# Patient Record
Sex: Male | Born: 1990 | Race: Black or African American | Hispanic: No | Marital: Single | Smoking: Former smoker
Health system: Southern US, Community
[De-identification: ages and names within clinical notes are randomized; demographics above are authoritative.]

---
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
## 2011-03-18 LAB — URINE CULTURE
Colony Count: NO GROWTH
Culture: NO GROWTH

## 2011-03-18 LAB — URINALYSIS, ROUTINE W REFLEX MICROSCOPIC
Bilirubin Urine: NEGATIVE
Glucose, UA: NEGATIVE mg/dL
Hgb urine dipstick: NEGATIVE
Ketones, ur: NEGATIVE mg/dL
Nitrite: NEGATIVE
Protein, ur: NEGATIVE mg/dL
Specific Gravity, Urine: 1.028 (ref 1.005–1.030)
Urobilinogen, UA: 1 mg/dL (ref 0.0–1.0)
pH: 7 (ref 5.0–8.0)

## 2011-03-18 LAB — GC/CHLAMYDIA PROBE AMP, URINE
Chlamydia, Swab/Urine, PCR: NEGATIVE
GC Probe Amp, Urine: POSITIVE — AB

## 2011-03-18 LAB — URINE MICROSCOPIC-ADD ON

## 2011-09-01 LAB — POCT RAPID STREP A: Streptococcus, Group A Screen (Direct): NEGATIVE

## 2011-09-01 LAB — INFLUENZA A AND B ANTIGEN (CONVERTED LAB)
Inflenza A Ag: NEGATIVE
Influenza B Ag: NEGATIVE

## 2011-09-09 LAB — COMPREHENSIVE METABOLIC PANEL
BUN: 9
CO2: 26
Calcium: 9.7
Glucose, Bld: 101 — ABNORMAL HIGH
Total Protein: 7.2

## 2011-09-09 LAB — STREP A DNA PROBE: Group A Strep Probe: NEGATIVE

## 2011-09-09 LAB — DIFFERENTIAL
Basophils Relative: 3 — ABNORMAL HIGH
Lymphs Abs: 0.8 — ABNORMAL LOW
Monocytes Relative: 8
Neutro Abs: 6.3
Neutrophils Relative %: 79 — ABNORMAL HIGH

## 2011-09-09 LAB — RAPID STREP SCREEN (MED CTR MEBANE ONLY): Streptococcus, Group A Screen (Direct): NEGATIVE

## 2011-09-09 LAB — CBC
Platelets: 237
WBC: 7.9

## 2011-09-09 LAB — LIPASE, BLOOD: Lipase: 71

## 2012-03-11 ENCOUNTER — Emergency Department (HOSPITAL_BASED_OUTPATIENT_CLINIC_OR_DEPARTMENT_OTHER)
Admission: EM | Admit: 2012-03-11 | Discharge: 2012-03-12 | Disposition: A | Discharge status: Home / Self Care | Payer: Self-pay | Attending: Emergency Medicine | Admitting: Emergency Medicine

## 2012-03-11 ENCOUNTER — Emergency Department (INDEPENDENT_AMBULATORY_CARE_PROVIDER_SITE_OTHER): Payer: Self-pay

## 2012-03-11 ENCOUNTER — Encounter (HOSPITAL_BASED_OUTPATIENT_CLINIC_OR_DEPARTMENT_OTHER): Payer: Self-pay | Admitting: *Deleted

## 2012-03-11 DIAGNOSIS — S82899A Other fracture of unspecified lower leg, initial encounter for closed fracture: Secondary | ICD-10-CM | POA: Insufficient documentation

## 2012-03-11 DIAGNOSIS — M25579 Pain in unspecified ankle and joints of unspecified foot: Secondary | ICD-10-CM | POA: Insufficient documentation

## 2012-03-11 DIAGNOSIS — R296 Repeated falls: Secondary | ICD-10-CM | POA: Insufficient documentation

## 2012-03-11 DIAGNOSIS — S82301A Unspecified fracture of lower end of right tibia, initial encounter for closed fracture: Secondary | ICD-10-CM

## 2012-03-11 DIAGNOSIS — F17200 Nicotine dependence, unspecified, uncomplicated: Secondary | ICD-10-CM | POA: Insufficient documentation

## 2012-03-11 DIAGNOSIS — Y9367 Activity, basketball: Secondary | ICD-10-CM | POA: Insufficient documentation

## 2012-03-11 NOTE — ED Notes (Signed)
Attempted to place pt in wheelchair, but he refused. Ice applied.

## 2012-03-11 NOTE — ED Notes (Signed)
Pt states he was playing bball earlier and twisted his right ankle. +dpp palp. Moves toes. Feels touch. Cap refill < 3 sec

## 2012-03-11 NOTE — ED Provider Notes (Signed)
History   This chart was scribed for Gabriel Racer, MD by Melba Coon. The patient was seen in room MH06/MH06 and the patient's care was started at 11:59PM.    CSN: [id number]  Arrival date & time 03/11/12  2202   First MD Initiated Contact with Patient 03/11/12 2354      Chief Complaint  Patient presents with  . Ankle Injury    (Consider location/radiation/quality/duration/timing/severity/associated sxs/prior treatment) HPI Nicko N Foulk is a 21 y.o. male who presents to the Emergency Department complaining of constant, moderate to severe right ankle pain with an onset this evening. Pt was playing basketball when he fell on his ankle. No head contact during ankle injury. Pt has taken Advil for the pain, which has not alleviated the symptoms. Ambulation aggravates the pain. No HA, fever, neck pain, sore throat, rash, back pain, CP, SOB, abd pain, n/v/d, dysuria, or extremity edema, weakness, numbness, or tingling. No known allergies. No other pertinent medical symptoms.  History reviewed. No pertinent past medical history.  History reviewed. No pertinent past surgical history.  History reviewed. No pertinent family history.  History  Substance Use Topics  . Smoking status: Current Everyday Smoker  . Smokeless tobacco: Not on file  . Alcohol Use: No      Review of Systems 10 Systems reviewed and all are negative for acute change except as noted in the HPI.   Allergies  Review of patient's allergies indicates no known allergies.  Home Medications   Current Outpatient Rx  Name Route Sig Dispense Refill  . IBUPROFEN 200 MG PO TABS Oral Take 600 mg by mouth once as needed. For pain    . OXYCODONE-ACETAMINOPHEN 5-325 MG PO TABS Oral Take 1 tablet by mouth every 4 (four) hours as needed for pain. 10 tablet 0    BP 137/69  Pulse 86  Temp(Src) 98.2 F (36.8 C) (Oral)  Resp 18  Ht 5\' 5"  (1.651 m)  Wt 150 lb (68.04 kg)  BMI 24.96 kg/m2  SpO2 95%  Physical Exam    Nursing note and vitals reviewed. Constitutional: He appears well-developed and well-nourished. No distress.  HENT:  Head: Normocephalic and atraumatic.  Right Ear: External ear normal.  Left Ear: External ear normal.  Eyes: Conjunctivae are normal. Right eye exhibits no discharge. Left eye exhibits no discharge. No scleral icterus.  Neck: Neck supple. No tracheal deviation present.  Cardiovascular: Normal rate, regular rhythm and intact distal pulses.   Pulses:      Popliteal pulses are 2+ on the right side, and 2+ on the left side.       Dorsalis pedis pulses are 2+ on the right side, and 2+ on the left side.       Posterior tibial pulses are 2+ on the right side, and 2+ on the left side.  Pulmonary/Chest: Effort normal and breath sounds normal. No stridor. No respiratory distress. He has no wheezes. He has no rales.  Abdominal: Soft. Bowel sounds are normal. He exhibits no distension. There is no tenderness. There is no rebound and no guarding.  Musculoskeletal: He exhibits no edema and no tenderness.       Tenderness to the medial mallelolus ; no tenderness to the lateral malleolus  Neurological: He is alert. He has normal strength. No sensory deficit. Cranial nerve deficit:  no gross defecits noted. He exhibits normal muscle tone. He displays no seizure activity. Coordination normal.       NT and motor intact  Skin: Skin is  warm and dry. No rash noted.  Psychiatric: He has a normal mood and affect.    ED Course  Procedures (including critical care time)  DIAGNOSTIC STUDIES: Oxygen Saturation is 95% on room air, adequate by my interpretation.    COORDINATION OF CARE:     Labs Reviewed - No data to display Dg Ankle Complete Right  03/12/2012  *RADIOLOGY REPORT*  Clinical Data: Twisted right ankle.  RIGHT ANKLE - COMPLETE 3+ VIEW  Comparison: None  Findings: The ankle mortise is maintained. On the lateral film there is an isolated nondisplaced fracture involving the posterior  malleolus of the distal tibia.  The visualized mid and hind foot bony structures are intact.  IMPRESSION:  Nondisplaced posterior tibial fracture (posterior malleolus).  Original Report Authenticated By: P. Loralie Champagne, M.D.     1. Closed fracture of right distal tibia       MDM  I personally performed the services described in this documentation, which was scribed in my presence. The recorded information has been reviewed and considered.  Discussed with Dr Lajoyce Corners. Splint and d/c home to f/u in office tomorrow. NWB and keep elevated. Pt voiced understanding      Gabriel Racer, MD 03/12/12 [phone]

## 2012-03-12 ENCOUNTER — Emergency Department (HOSPITAL_BASED_OUTPATIENT_CLINIC_OR_DEPARTMENT_OTHER): Payer: Self-pay

## 2012-03-12 DIAGNOSIS — S8253XA Displaced fracture of medial malleolus of unspecified tibia, initial encounter for closed fracture: Secondary | ICD-10-CM

## 2012-03-12 DIAGNOSIS — X500XXA Overexertion from strenuous movement or load, initial encounter: Secondary | ICD-10-CM

## 2012-03-12 MED ORDER — TRAMADOL HCL 50 MG PO TABS
50.0000 mg | ORAL_TABLET | Freq: Once | ORAL | Status: AC
  Administered 2012-03-12: 50 mg via ORAL
  Filled 2012-03-12: qty 1

## 2012-03-12 MED ORDER — OXYCODONE-ACETAMINOPHEN 5-325 MG PO TABS: 1.0000 | ORAL_TABLET | ORAL | Status: AC | PRN

## 2012-03-12 MED ORDER — OXYCODONE-ACETAMINOPHEN 5-325 MG PO TABS
1.0000 | ORAL_TABLET | Freq: Once | ORAL | Status: AC
  Administered 2012-03-12: 1 via ORAL
  Filled 2012-03-12: qty 1

## 2012-03-12 NOTE — Discharge Instructions (Signed)
Call Dr Lajoyce Corners in the morning to arrange follow up appointment. Keep ankle elevated and do not put weight on it. Take pain medication as prescribed.    Ankle Fracture A fracture is a break in the bone. A cast or splint is used to protect and keep your injured bone from moving.  HOME CARE INSTRUCTIONS   Use your crutches as directed.   To lessen the swelling, keep the injured leg elevated while sitting or lying down.   Apply ice to the injury for 15 to 20 minutes, 3 to 4 times per day while awake for 2 days. Put the ice in a plastic bag and place a thin towel between the bag of ice and your cast.   If you have a plaster or fiberglass cast:   Do not try to scratch the skin under the cast using sharp or pointed objects.   Check the skin around the cast every day. You may put lotion on any red or sore areas.   Keep your cast dry and clean.   If you have a plaster splint:   Wear the splint as directed.   You may loosen the elastic around the splint if your toes become numb, tingle, or turn cold or blue.   Do not put pressure on any part of your cast or splint; it may break. Rest your cast only on a pillow the first 24 hours until it is fully hardened.   Your cast or splint can be protected during bathing with a plastic bag. Do not lower the cast or splint into water.   Take medications as directed by your caregiver. Only take over-the-counter or prescription medicines for pain, discomfort, or fever as directed by your caregiver.   Do not drive a vehicle until your caregiver specifically tells you it is safe to do so.   If your caregiver has given you a follow-up appointment, it is very important to keep that appointment. Not keeping the appointment could result in a chronic or permanent injury, pain, and disability. If there is any problem keeping the appointment, you must call back to this facility for assistance.  SEEK IMMEDIATE MEDICAL CARE IF:   Your cast gets damaged or breaks.     You have continued severe pain or more swelling than you did before the cast was put on.   Your skin or toenails below the injury turn blue or gray, or feel cold or numb.   There is a bad smell or new stains and/or purulent (pus like) drainage coming from under the cast.  If you do not have a window in your cast for observing the wound, a discharge or minor bleeding may show up as a stain on the outside of your cast. Report these findings to your caregiver. MAKE SURE YOU:   Understand these instructions.   Will watch your condition.   Will get help right away if you are not doing well or get worse.  Document Released: 11/25/2000 Document Revised: 11/17/2011 Document Reviewed: 07/01/2008 [facility] Patient Information 2012 [place], [place].

## 2014-06-23 ENCOUNTER — Emergency Department (HOSPITAL_BASED_OUTPATIENT_CLINIC_OR_DEPARTMENT_OTHER): Payer: Self-pay

## 2014-06-23 ENCOUNTER — Encounter (HOSPITAL_BASED_OUTPATIENT_CLINIC_OR_DEPARTMENT_OTHER): Payer: Self-pay | Admitting: Emergency Medicine

## 2014-06-23 ENCOUNTER — Emergency Department (HOSPITAL_BASED_OUTPATIENT_CLINIC_OR_DEPARTMENT_OTHER)
Admission: EM | Admit: 2014-06-23 | Discharge: 2014-06-23 | Disposition: A | Discharge status: Home / Self Care | Payer: Self-pay | Attending: Emergency Medicine | Admitting: Emergency Medicine

## 2014-06-23 DIAGNOSIS — F17200 Nicotine dependence, unspecified, uncomplicated: Secondary | ICD-10-CM | POA: Insufficient documentation

## 2014-06-23 DIAGNOSIS — M545 Low back pain, unspecified: Secondary | ICD-10-CM | POA: Insufficient documentation

## 2014-06-23 DIAGNOSIS — M546 Pain in thoracic spine: Secondary | ICD-10-CM

## 2014-06-23 MED ORDER — IBUPROFEN 600 MG PO TABS: 600.0000 mg | ORAL_TABLET | Freq: Four times a day (QID) | ORAL | Status: DC | PRN

## 2014-06-23 MED ORDER — OXYCODONE-ACETAMINOPHEN 5-325 MG PO TABS
1.0000 | ORAL_TABLET | Freq: Once | ORAL | Status: AC
  Administered 2014-06-23: 1 via ORAL
  Filled 2014-06-23: qty 1

## 2014-06-23 NOTE — Discharge Instructions (Signed)
° ° °  Back Exercises Back exercises help treat and prevent back injuries. The goal is to increase your strength in your belly (abdominal) and back muscles. These exercises can also help with flexibility. Start these exercises when told by your doctor. HOME CARE Back exercises include: Pelvic Tilt.  Lie on your back with your knees bent. Tilt your pelvis until the lower part of your back is against the floor. Hold this position 5 to 10 sec. Repeat this exercise 5 to 10 times. Knee to Chest.  Pull 1 knee up against your chest and hold for 20 to 30 seconds. Repeat this with the other knee. This may be done with the other leg straight or bent, whichever feels better. Then, pull both knees up against your chest. Sit-Ups or Curl-Ups.  Bend your knees 90 degrees. Start with tilting your pelvis, and do a partial, slow sit-up. Only lift your upper half 30 to 45 degrees off the floor. Take at least 2 to 3 seonds for each sit-up. Do not do sit-ups with your knees out straight. If partial sit-ups are difficult, simply do the above but with only tightening your belly (abdominal) muscles and holding it as told. Hip-Lift.  Lie on your back with your knees flexed 90 degrees. Push down with your feet and shoulders as you raise your hips 2 inches off the floor. Hold for 10 seconds, repeat 5 to 10 times. Back Arches.  Lie on your stomach. Prop yourself up on bent elbows. Slowly press on your hands, causing an arch in your low back. Repeat 3 to 5 times. Shoulder-Lifts.  Lie face down with arms beside your body. Keep hips and belly pressed to floor as you slowly lift your head and shoulders off the floor. Do not overdo your exercises. Be careful in the beginning. Exercises may cause you some mild back discomfort. If the pain lasts for more than 15 minutes, stop the exercises until you see your doctor. Improvement with exercise for back problems is slow.  Document Released: 12/31/2010 Document Revised: 02/20/2012  Document Reviewed: 09/29/2011 [facility] Patient Information 2015 [place], [place]. This information is not intended to replace advice given to you by your health care provider. Make sure you discuss any questions you have with your health care provider.   SEEK IMMEDIATE MEDICAL ATTENTION IF: New numbness, tingling, weakness, or problem with the use of your arms or legs.  Severe back pain not relieved with medications.  Change in bowel or bladder control (if you lose control of stool or urine, or if you are unable to urinate) Increasing pain in any areas of the body (such as chest or abdominal pain).  Shortness of breath, dizziness or fainting.  Nausea (feeling sick to your stomach), vomiting, fever, or sweats.

## 2014-06-23 NOTE — ED Notes (Signed)
To x-ray

## 2014-06-23 NOTE — ED Notes (Addendum)
Pt states hx lower back pain for "years". States this morning he awoke to pain in his lower back. Denies any recent injury. Denies any urinary problems. Describes as a sharp pain that does not radiate. Has not seen an ortho MD in this past for his back issues.

## 2014-06-23 NOTE — ED Provider Notes (Signed)
CSN: [id number]     Arrival date & time 06/23/14  [id number] History   First MD Initiated Contact with Patient 06/23/14 0645     Chief Complaint  Patient presents with  . Back Pain      Patient is a [age] y.o. male presenting with back pain. The history is provided by the patient.  Back Pain Location:  Thoracic spine and lumbar spine Quality: sharp. Radiates to:  Does not radiate Pain severity:  Moderate Onset quality:  Gradual Timing:  Constant Progression:  Worsening Chronicity:  Recurrent Relieved by:  Being still Worsened by:  Movement Associated symptoms: no abdominal pain, no bladder incontinence, no dysuria, no fever and no weakness   pt reports he has had back pain for "years" usually worse in the morning but seems worse today No trauma or recent falls No leg weakness No h/o back surgery No urinary incontinence reported  PMH - none    History  Substance Use Topics  . Smoking status: Current Every Day Smoker  . Smokeless tobacco: Not on file  . Alcohol Use: No    Review of Systems  Constitutional: Negative for fever and unexpected weight change.  Gastrointestinal: Negative for vomiting and abdominal pain.  Genitourinary: Negative for bladder incontinence and dysuria.  Musculoskeletal: Positive for back pain.  Neurological: Negative for weakness.  All other systems reviewed and are negative.     Allergies  Review of patient's allergies indicates no known allergies.  Home Medications   Prior to Admission medications   Medication Sig Start Date End Date Taking? Authorizing Provider  ibuprofen (ADVIL,MOTRIN) 200 MG tablet Take 600 mg by mouth once as needed. For pain    Historical Provider, MD   BP 127/75  Pulse 67  Temp(Src) 97.7 F (36.5 C) (Oral)  Resp 18  Ht 5\' 5"  (1.651 m)  Wt 150 lb 2 oz (68.096 kg)  BMI 24.98 kg/m2 Physical Exam CONSTITUTIONAL: Well developed/well nourished HEAD: Normocephalic/atraumatic EYES: EOMI/PERRL ENMT: Mucous membranes  moist NECK: supple no meningeal signs SPINE:thoracic/lumbar spinal tenderness.  No bruising/crepitance/stepoffs noted to spine CV: S1/S2 noted, no murmurs/rubs/gallops noted LUNGS: Lungs are clear to auscultation bilaterally, no apparent distress ABDOMEN: soft, nontender, no rebound or guarding GU:no cva tenderness NEURO: Awake/alert,equal motor 5/5 strength noted with the following: hip flexion/knee flexion/extension, foot dorsi/plantar flexion, no sensory deficit in any dermatome.  Equal patellar/achilles reflex noted (2+) in bilateral lower extremities.  Pt is able to ambulate unassisted. EXTREMITIES: pulses normal, full ROM SKIN: warm, color normal PSYCH: no abnormalities of mood noted   ED Course  Procedures  6:59 AM I offered imaging to patient due to persistent daily back pain for "years" with recent worsening He has no neuro deficits He is well appearing  Advised he will need f/u as outpatient and referral information given to patient   MDM   Final diagnoses:  Midline thoracic back pain  Midline low back pain without sciatica    Nursing notes including past medical history and social history reviewed and considered in documentation     Joya Gaskinsonald W Andi Mahaffy, MD 06/23/14 0700

## 2014-06-23 NOTE — ED Notes (Signed)
MD with pt  

## 2015-04-12 ENCOUNTER — Encounter (HOSPITAL_BASED_OUTPATIENT_CLINIC_OR_DEPARTMENT_OTHER): Payer: Self-pay

## 2015-04-12 ENCOUNTER — Emergency Department (HOSPITAL_BASED_OUTPATIENT_CLINIC_OR_DEPARTMENT_OTHER)
Admission: EM | Admit: 2015-04-12 | Discharge: 2015-04-12 | Disposition: A | Discharge status: Home / Self Care | Payer: Self-pay | Attending: Emergency Medicine | Admitting: Emergency Medicine

## 2015-04-12 DIAGNOSIS — L600 Ingrowing nail: Secondary | ICD-10-CM | POA: Insufficient documentation

## 2015-04-12 DIAGNOSIS — L03031 Cellulitis of right toe: Secondary | ICD-10-CM | POA: Insufficient documentation

## 2015-04-12 DIAGNOSIS — Z720 Tobacco use: Secondary | ICD-10-CM | POA: Insufficient documentation

## 2015-04-12 MED ORDER — LIDOCAINE HCL (PF) 1 % IJ SOLN
10.0000 mL | Freq: Once | INTRAMUSCULAR | Status: AC
  Administered 2015-04-12: 10 mL
  Filled 2015-04-12: qty 10

## 2015-04-12 NOTE — ED Notes (Signed)
Patient here with right foot, pinky toe pain after trying to remove ingrown toenail 1 week ago. Swelling, bruising noted to same.

## 2015-04-12 NOTE — Discharge Instructions (Signed)
Ingrown Toenail An ingrown toenail occurs when the sharp edge of your toenail grows into the skin. Causes of ingrown toenails include toenails clipped too far back or poorly fitting shoes. Activities involving sudden stops (basketball, tennis) causing "toe jamming" may lead to an ingrown nail. HOME CARE INSTRUCTIONS   Soak the whole foot in warm soapy water for 20 minutes, 3 times per day.  You may lift the edge of the nail away from the sore skin by wedging a small piece of cotton under the corner of the nail. Be careful not to dig (traumatize) and cause more injury to the area.  Wear shoes that fit well. While the ingrown nail is causing problems, sandals may be beneficial.  Trim your toenails regularly and carefully. Cut your toenails straight across, not in a curve. This will prevent injury to the skin at the corners of the toenail.  Keep your feet clean and dry.  Crutches may be helpful early in treatment if walking is painful.  Antibiotics, if prescribed, should be taken as directed.  Return for a wound check in 2 days or as directed.  Only take over-the-counter or prescription medicines for pain, discomfort, or fever as directed by your caregiver. SEEK IMMEDIATE MEDICAL CARE IF:   You have a fever.  You have increasing pain, redness, swelling, or heat at the wound site.  Your toe is not better in 7 days. If conservative treatment is not successful, surgical removal of a portion or all of the nail may be necessary. MAKE SURE YOU:   Understand these instructions.  Will watch your condition.  Will get help right away if you are not doing well or get worse. Document Released: 11/25/2000 Document Revised: 02/20/2012 Document Reviewed: 11/19/2008 [facility] Patient Information 2015 [place], [place]. This information is not intended to replace advice given to you by your health care provider. Make sure you discuss any questions you have with your health care  provider.  Paronychia Paronychia is an inflammatory reaction involving the folds of the skin surrounding the fingernail (toenail). This is commonly caused by an infection in the skin around a nail. The most common cause of paronychia is frequent wetting of the hands (as seen with bartenders, food servers, nurses or others who wet their hands). This makes the skin around the fingernail susceptible to infection by bacteria (germs) or fungus. Other predisposing factors are:  Aggressive manicuring.  Nail biting.  Thumb sucking. The most common cause is a staphylococcal (a type of germ) infection, or a fungal (Candida) infection. When caused by a germ, it usually comes on suddenly with redness, swelling, pus and is often painful. It may get under the nail and form an abscess (collection of pus), or form an abscess around the nail. If the nail itself is infected with a fungus, the treatment is usually prolonged and may require oral medicine for up to one year. Your caregiver will determine the length of time treatment is required. The paronychia caused by bacteria (germs) may largely be avoided by not pulling on hangnails or picking at cuticles. When the infection occurs at the tips of the finger it is called felon. When the cause of paronychia is from the herpes simplex virus (HSV) it is called herpetic whitlow. TREATMENT  When an abscess is present treatment is often incision and drainage. This means that the abscess must be cut open so the pus can get out. When this is done, the following home care instructions should be followed. HOME CARE INSTRUCTIONS  It is important to keep the affected fingers very dry. Rubber or plastic gloves over cotton gloves should be used whenever the hand must be placed in water.  Keep wound clean, dry and dressed as suggested by your caregiver between warm soaks or warm compresses.  Soak in warm water for fifteen to twenty minutes three to four times per day for  bacterial infections. Fungal infections are very difficult to treat, so often require treatment for long periods of time.  For bacterial (germ) infections take antibiotics (medicine which kill germs) as directed and finish the prescription, even if the problem appears to be solved before the medicine is gone.  Only take over-the-counter or prescription medicines for pain, discomfort, or fever as directed by your caregiver. SEEK IMMEDIATE MEDICAL CARE IF:  You have redness, swelling, or increasing pain in the wound.  You notice pus coming from the wound.  You have a fever.  You notice a bad smell coming from the wound or dressing. Document Released: 05/24/2001 Document Revised: 02/20/2012 Document Reviewed: 01/23/2009 [facility] Patient Information 2015 [place], [place]. This information is not intended to replace advice given to you by your health care provider. Make sure you discuss any questions you have with your health care provider.

## 2015-04-12 NOTE — ED Notes (Signed)
MD at bedside for procedure.

## 2015-04-12 NOTE — ED Provider Notes (Signed)
CSN: [id number]     Arrival date & time 04/12/15  0917 History   First MD Initiated Contact with Patient 04/12/15 1050     Chief Complaint  Patient presents with  . Toe Pain     (Consider location/radiation/quality/duration/timing/severity/associated sxs/prior Treatment) Patient is a 24 y.o. male presenting with toe pain.  Toe Pain This is a new problem. The current episode started more than 2 days ago. The problem occurs constantly. The problem has been gradually worsening. Pertinent negatives include no chest pain and no shortness of breath. Associated symptoms comments: No fevers. Nothing aggravates the symptoms. Nothing relieves the symptoms. Treatments tried: tried to remove nail about a week ago. The treatment provided no relief.    History reviewed. No pertinent past medical history. History reviewed. No pertinent past surgical history. No family history on file. History  Substance Use Topics  . Smoking status: Current Every Day Smoker  . Smokeless tobacco: Not on file  . Alcohol Use: No    Review of Systems  Respiratory: Negative for shortness of breath.   Cardiovascular: Negative for chest pain.  All other systems reviewed and are negative.     Allergies  Review of patient's allergies indicates no known allergies.  Home Medications   Prior to Admission medications   Not on File   BP 144/76 mmHg  Pulse 78  Temp(Src) 98 F (36.7 C) (Oral)  Resp 20  Ht  (1.651 m)  Wt 160 lb (72.576 kg)  BMI 26.63 kg/m2  SpO2 100% Physical Exam  Constitutional: He is oriented to person, place, and time. He appears well-developed and well-nourished. No distress.  HENT:  Head: Normocephalic and atraumatic.  Eyes: Conjunctivae are normal. No scleral icterus.  Neck: Neck supple.  Cardiovascular: Normal rate and intact distal pulses.   Pulmonary/Chest: Effort normal. No stridor. No respiratory distress.  Abdominal: Normal appearance. He exhibits no distension.   Neurological: He is alert and oriented to person, place, and time.  Skin: Skin is warm and dry. No rash noted.  Swelling and erythema of medial right little toe.  Psychiatric: He has a normal mood and affect. His behavior is normal.  Nursing note and vitals reviewed.   ED Course  NAIL REMOVAL Date/Time: 04/12/2015 2:22 PM Performed by: Blake Divine Authorized by: Blake Divine Consent: Verbal consent obtained. Risks and benefits: risks, benefits and alternatives were discussed Consent given by: patient Location: right foot Location details: right little toe Anesthesia: digital block Local anesthetic: lidocaine 1% without epinephrine Anesthetic total: 3 ml Preparation: skin prepped with Betadine Amount removed: partial Nail removed location: medial. Dressing: gauze roll Patient tolerance: Patient tolerated the procedure well with no immediate complications  INCISION AND DRAINAGE Date/Time: 04/12/2015 2:24 PM Performed by: Blake Divine Authorized by: Blake Divine Consent: Verbal consent obtained. Risks and benefits: risks, benefits and alternatives were discussed Consent given by: patient Indications for incision and drainage: paronychia. Body area: lower extremity Location details: right little toe Anesthesia: digital block Scalpel size: 11 Incision type: single straight Complexity: simple Drainage: purulent Drainage amount: scant Wound treatment: wound left open Patient tolerance: Patient tolerated the procedure well with no immediate complications   (including critical care time) Labs Review Labs Reviewed - No data to display  Imaging Review No results found.   EKG Interpretation None      MDM   Final diagnoses:  Ingrown toenail  Paronychia of fifth toe, right    24 yo male with paronychia on right secondary to ingrown toenail.  Toenail partially removed, paronychia drained.      Blake DivineJohn Keighley Deckman, MD 04/12/15 1425

## 2015-11-27 IMAGING — CR DG THORACIC SPINE 4+V
3 series · 3 of 3 positions shown · non-contrast
Comparison: Chest x-ray dated 07/14/2008

CLINICAL DATA: Back pain and spasms.

EXAM:
THORACIC SPINE - 4+ VIEW

[t t-spine a.p.]
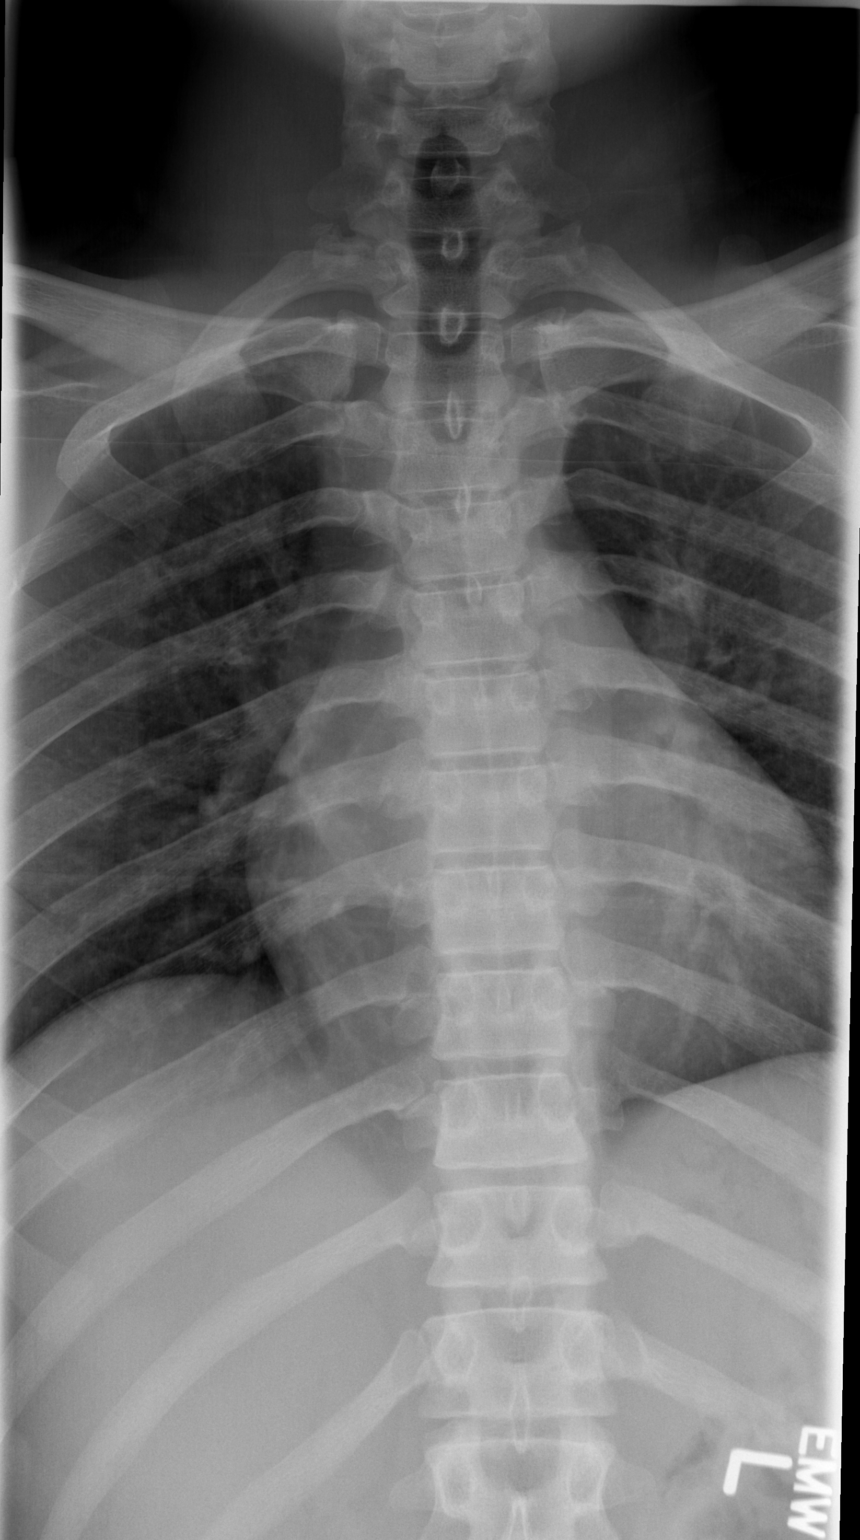

[t t-spine lat]
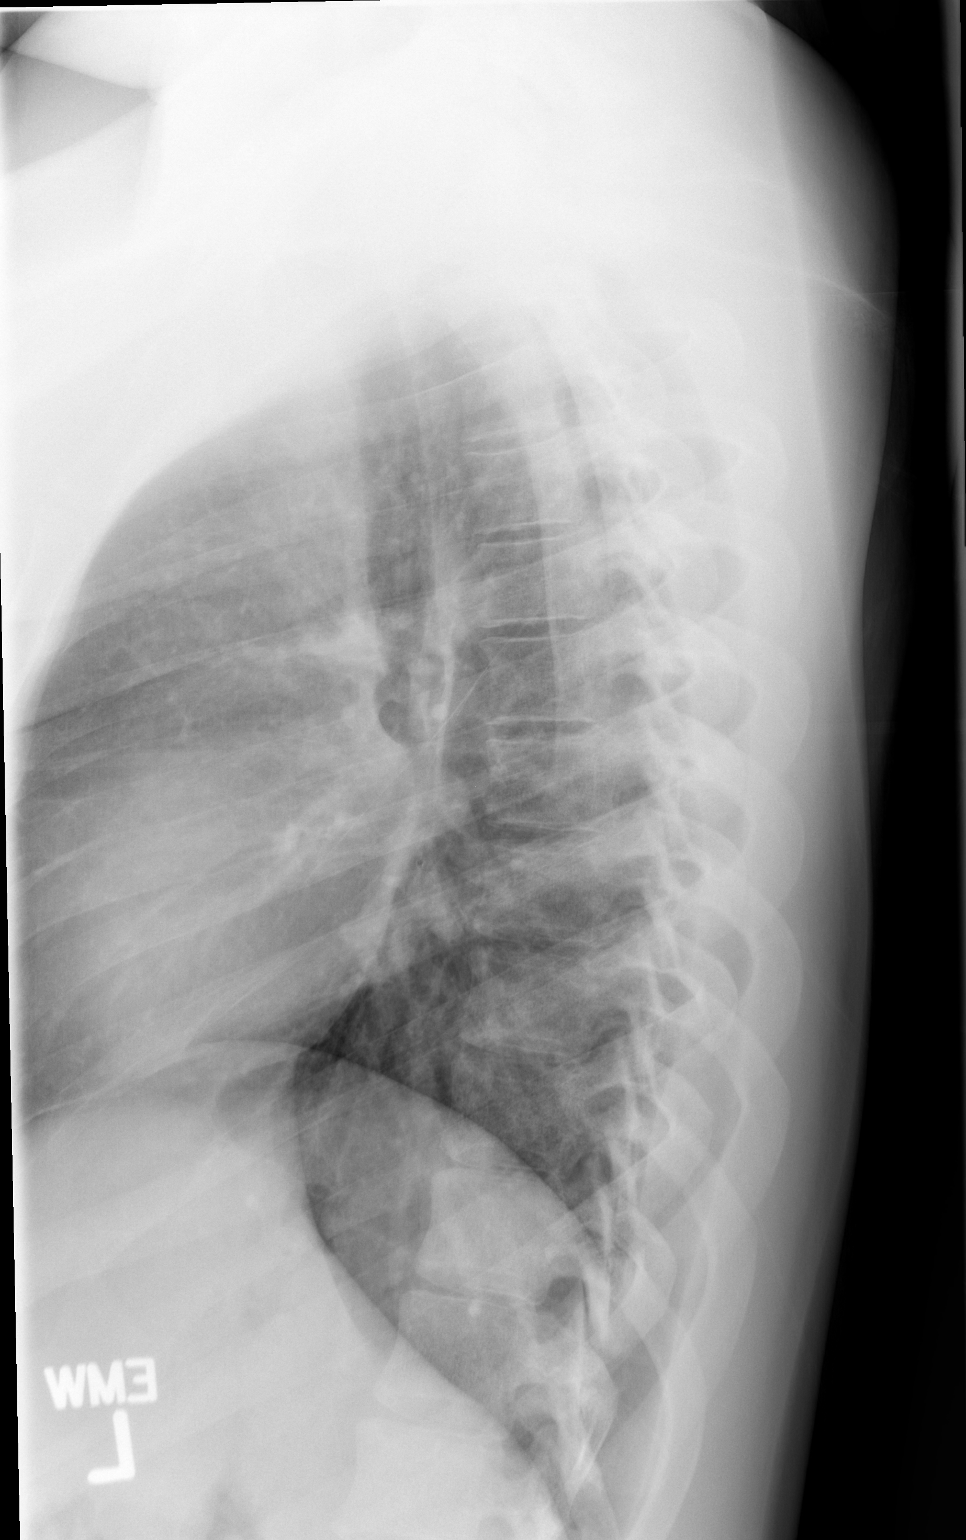

[t swimmers]
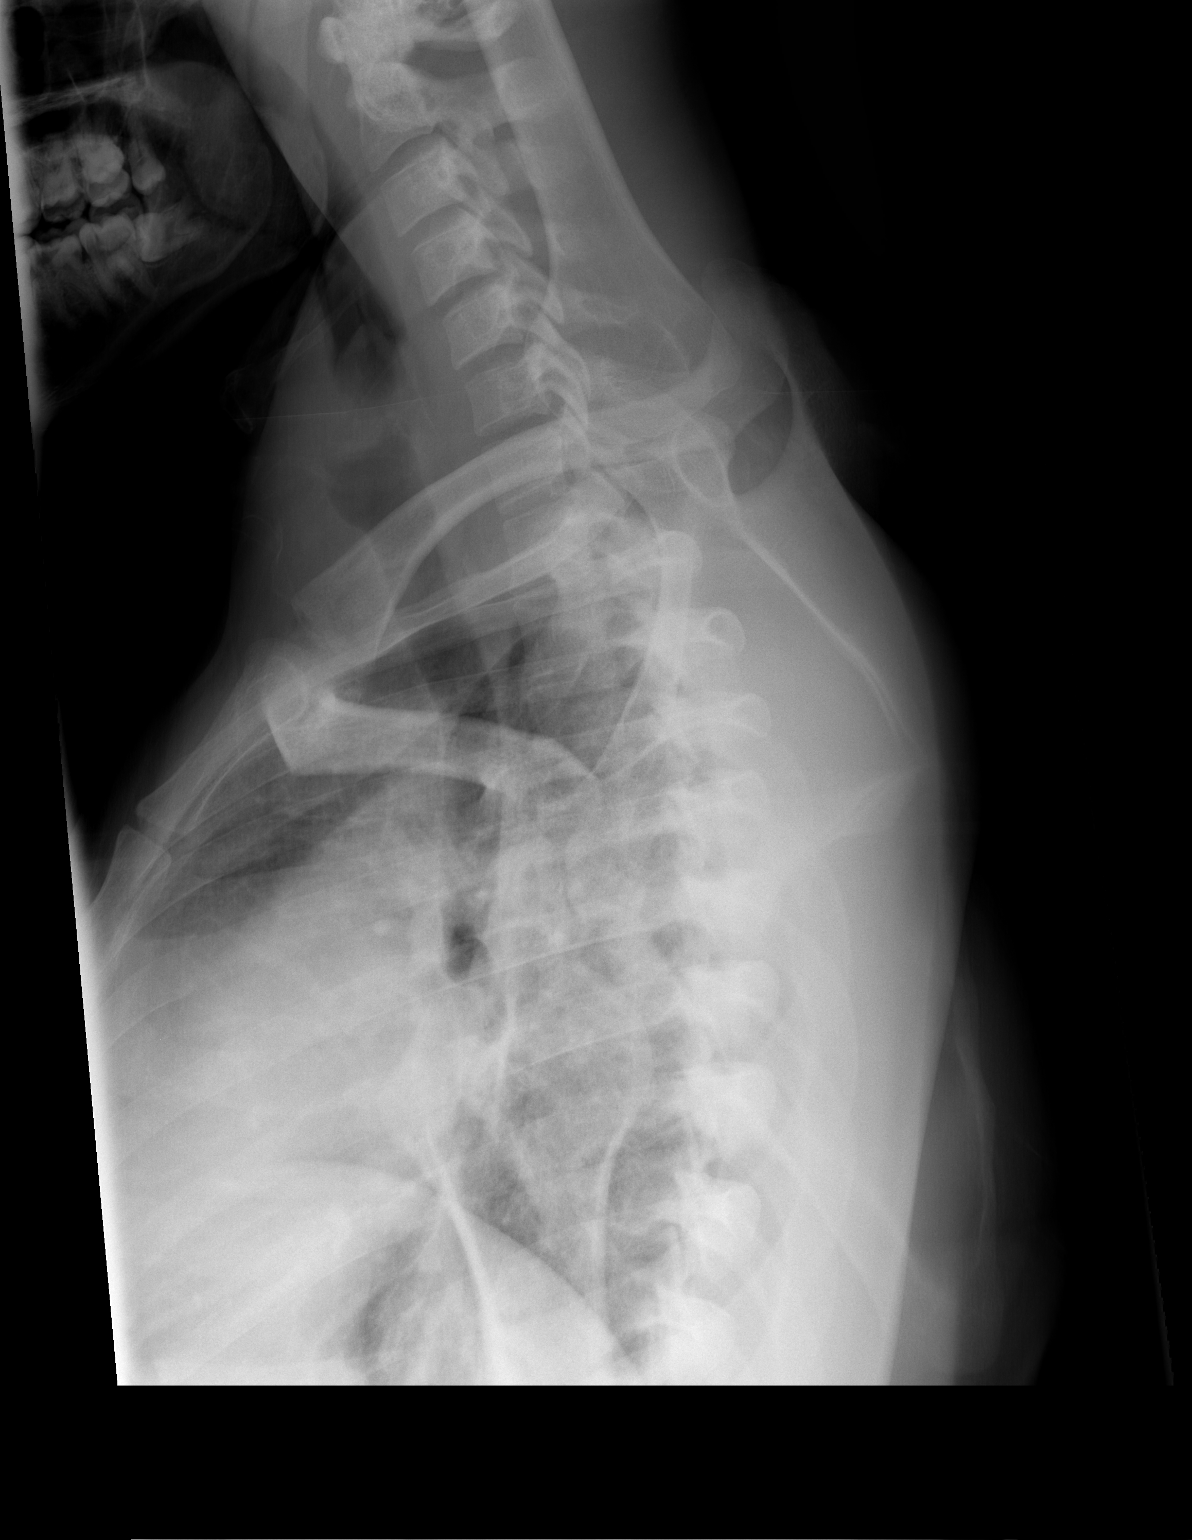

[3 of 3 positions shown; findings below may reference images not displayed]

FINDINGS: There is a very slight upper thoracic scoliosis centered at T3 with
convexity to the right. The bones are otherwise normal. No disc
space narrowing, fracture, or bone destruction.
IMPRESSION: Minimal upper thoracic scoliosis.

## 2016-04-02 ENCOUNTER — Encounter (HOSPITAL_BASED_OUTPATIENT_CLINIC_OR_DEPARTMENT_OTHER): Payer: Self-pay | Admitting: *Deleted

## 2016-04-02 ENCOUNTER — Emergency Department (HOSPITAL_BASED_OUTPATIENT_CLINIC_OR_DEPARTMENT_OTHER)
Admission: EM | Admit: 2016-04-02 | Discharge: 2016-04-02 | Disposition: A | Discharge status: Home / Self Care | Payer: Self-pay | Attending: Emergency Medicine | Admitting: Emergency Medicine

## 2016-04-02 DIAGNOSIS — K0889 Other specified disorders of teeth and supporting structures: Secondary | ICD-10-CM | POA: Insufficient documentation

## 2016-04-02 DIAGNOSIS — F17200 Nicotine dependence, unspecified, uncomplicated: Secondary | ICD-10-CM | POA: Insufficient documentation

## 2016-04-02 MED ORDER — NAPROXEN 250 MG PO TABS: 250.0000 mg | ORAL_TABLET | Freq: Two times a day (BID) | ORAL | Status: DC

## 2016-04-02 MED ORDER — PENICILLIN V POTASSIUM 500 MG PO TABS: 500.0000 mg | ORAL_TABLET | Freq: Four times a day (QID) | ORAL | Status: DC

## 2016-04-02 NOTE — ED Notes (Signed)
Pt reports he developed dental pain yesterday; denies fever, known drainage.

## 2016-04-02 NOTE — ED Provider Notes (Signed)
CSN: [id number]     Arrival date & time 04/02/16  1241 History   First MD Initiated Contact with Patient 04/02/16 1337     Chief Complaint  Patient presents with  . Dental Pain    Harutyun N Cone is a 25 y.o. male who Presents to the emergency department complaining of left lower dental pain since yesterday. He reports 9/10 pain currently. He reports his pain is worse with chewing and touching. He reports using some Goody powders with little relief today. He denies sore throat or trouble swallowing. He does not have a dentist. He denies fevers or discharge from his mouth. He denies neck pain or trouble swallowing.   Patient is a 25 y.o. male presenting with tooth pain. The history is provided by the patient. No language interpreter was used.  Dental Pain Associated symptoms: no congestion, no drooling, no facial swelling, no fever, no headaches, no neck pain and no oral lesions     History reviewed. No pertinent past medical history. History reviewed. No pertinent past surgical history. History reviewed. No pertinent family history. Social History  Substance Use Topics  . Smoking status: Current Every Day Smoker  . Smokeless tobacco: Never Used  . Alcohol Use: Yes     Comment: every weekend    Review of Systems  Constitutional: Negative for fever and chills.  HENT: Positive for dental problem. Negative for congestion, drooling, ear discharge, ear pain, facial swelling, mouth sores, rhinorrhea, sore throat and trouble swallowing.   Eyes: Negative for visual disturbance.  Respiratory: Negative for cough and shortness of breath.   Cardiovascular: Negative for chest pain.  Gastrointestinal: Negative for nausea, vomiting and abdominal pain.  Musculoskeletal: Negative for neck pain and neck stiffness.  Skin: Negative for rash.  Neurological: Negative for light-headedness and headaches.      Allergies  Review of patient's allergies indicates no known allergies.  Home Medications    Prior to Admission medications   Medication Sig Start Date End Date Taking? Authorizing Provider  naproxen (NAPROSYN) 250 MG tablet Take 1 tablet (250 mg total) by mouth 2 (two) times daily with a meal. 04/02/16   Everlene FarrierWilliam Lerin Jech, PA-C  penicillin v potassium (VEETID) 500 MG tablet Take 1 tablet (500 mg total) by mouth 4 (four) times daily. 04/02/16   Everlene FarrierWilliam Corie Vavra, PA-C   BP 128/53 mmHg  Pulse 65  Temp(Src) 98.5 F (36.9 C) (Oral)  Resp 18  Ht 5\' 5"  (1.651 m)  Wt 72.576 kg  BMI 26.63 kg/m2  SpO2 98% Physical Exam  Constitutional: He appears well-developed and well-nourished. No distress.  Non-toxic appearing.   HENT:  Head: Normocephalic and atraumatic.  Right Ear: External ear normal.  Left Ear: External ear normal.  Mouth/Throat: Oropharynx is clear and moist. No oropharyngeal exudate.  Tenderness to left lower molar.  No discharge from the mouth. No facial swelling.  Uvula is midline without edema. Soft palate rises symmetrically. No tonsillar exudates. Mild bilateral tonsillar hypertrophy. Tongue protrusion is normal. No trismus.  Bilateral tympanic membranes are pearly-gray without erythema or loss of landmarks.   Eyes: Conjunctivae and EOM are normal. Pupils are equal, round, and reactive to light. Right eye exhibits no discharge. Left eye exhibits no discharge.  Neck: Normal range of motion. Neck supple. No JVD present. No tracheal deviation present.  Cardiovascular: Normal rate, regular rhythm, normal heart sounds and intact distal pulses.  Exam reveals no gallop and no friction rub.   No murmur heard. Pulmonary/Chest: Effort normal and breath  sounds normal. No respiratory distress. He has no wheezes. He has no rales.  Abdominal: Soft. There is no tenderness.  Musculoskeletal: He exhibits no edema.  Lymphadenopathy:    He has no cervical adenopathy.  Neurological: He is alert. Coordination normal.  Skin: Skin is warm and dry. No rash noted. He is not diaphoretic. No  erythema. No pallor.  Psychiatric: He has a normal mood and affect. His behavior is normal.  Nursing note and vitals reviewed.   ED Course  Procedures (including critical care time) Labs Review Labs Reviewed - No data to display  Imaging Review No results found. I have personally reviewed and evaluated these images and lab results as part of my medical decision-making.   EKG Interpretation None      Filed Vitals:   04/02/16 1259  BP: 128/53  Pulse: 65  Temp: 98.5 F (36.9 C)  TempSrc: Oral  Resp: 18  Height:  (1.651 m)  Weight: 72.576 kg  SpO2: 98%     MDM   Meds given in ED:  Medications - No data to display  New Prescriptions   NAPROXEN (NAPROSYN) 250 MG TABLET    Take 1 tablet (250 mg total) by mouth 2 (two) times daily with a meal.   PENICILLIN V POTASSIUM (VEETID) 500 MG TABLET    Take 1 tablet (500 mg total) by mouth 4 (four) times daily.    Final diagnoses:  Pain, dental   This s a 25 y.o. male who Presents to the emergency department complaining of left lower dental pain since yesterday. He reports 9/10 pain currently. He reports his pain is worse with chewing and touching. He reports using some Goody powders with little relief today. He denies sore throat or trouble swallowing. On exam the patient is afebrile nontoxic appearing. He has some left lower molar dental tenderness to palpation. Patient with toothache.  No gross abscess.  Exam unconcerning for Ludwig's angina or spread of infection.  Will treat with penicillin and pain medicine.  Urged patient to follow-up with dentist Dr. Mayford Knife. I encouraged him to take his discharge instructions to his appointment. I advised the patient to follow-up with their primary care provider this week. I advised the patient to return to the emergency department with new or worsening symptoms or new concerns. The patient verbalized understanding and agreement with plan.      Everlene Farrier, PA-C 04/02/16  1417  Pricilla Loveless, MD 04/03/16 [phone]

## 2016-04-02 NOTE — Discharge Instructions (Signed)

## 2016-04-02 NOTE — ED Notes (Signed)
Left lower back tooth, swollen, abscessed, painful to touch and chew,

## 2023-03-17 ENCOUNTER — Encounter (HOSPITAL_BASED_OUTPATIENT_CLINIC_OR_DEPARTMENT_OTHER): Payer: Self-pay | Admitting: Emergency Medicine

## 2023-03-17 ENCOUNTER — Emergency Department (HOSPITAL_BASED_OUTPATIENT_CLINIC_OR_DEPARTMENT_OTHER)
Admission: EM | Admit: 2023-03-17 | Discharge: 2023-03-17 | Disposition: A | Discharge status: Home / Self Care | Payer: 59 | Attending: Emergency Medicine | Admitting: Emergency Medicine

## 2023-03-17 ENCOUNTER — Other Ambulatory Visit: Payer: Self-pay

## 2023-03-17 ENCOUNTER — Emergency Department (HOSPITAL_BASED_OUTPATIENT_CLINIC_OR_DEPARTMENT_OTHER): Payer: 59

## 2023-03-17 DIAGNOSIS — R0789 Other chest pain: Secondary | ICD-10-CM | POA: Diagnosis not present

## 2023-03-17 NOTE — ED Notes (Signed)
Discharge instructions reviewed with patient. Patient verbalizes understanding, no further questions at this time. Medications/prescriptions and follow up information provided. No acute distress noted at time of departure.  

## 2023-03-17 NOTE — ED Provider Notes (Signed)
[place] [facility] Provider Note   CSN: [id number] Arrival date & time: 03/17/23  0725     History  Chief Complaint  Patient presents with   Chest Pain   left rib pain    Gabriel Simmons is a 32 y.o. male.  32 yo M with a chief complaints of left-sided chest discomfort.  Is been going on for about a week.  He was involved in some sort of horseplay with family members and woke up the next morning with some initial discomfort symptoms has persisted since then.  Worse with moving twisting bending deep breathing.  Denies difficulty breathing denies cough.  Denies rash.   Chest Pain      Home Medications Prior to Admission medications   Medication Sig Start Date End Date Taking? Authorizing Provider  naproxen (NAPROSYN) 250 MG tablet Take 1 tablet (250 mg total) by mouth 2 (two) times daily with a meal. 04/02/16   Everlene Farrieransie, William, PA-C  penicillin v potassium (VEETID) 500 MG tablet Take 1 tablet (500 mg total) by mouth 4 (four) times daily. 04/02/16   Everlene Farrieransie, William, PA-C      Allergies    Patient has no known allergies.    Review of Systems   Review of Systems  Cardiovascular:  Positive for chest pain.    Physical Exam Updated Vital Signs BP (!) 133/91 (BP Location: Left Arm)   Pulse 81   Temp 98.3 F (36.8 C) (Oral)   Resp 16   Ht 5\' 5"  (1.651 m)   Wt 81.6 kg   SpO2 97%   BMI 29.95 kg/m  Physical Exam Vitals and nursing note reviewed.  Constitutional:      Appearance: He is well-developed.  HENT:     Head: Normocephalic and atraumatic.  Eyes:     Pupils: Pupils are equal, round, and reactive to light.  Neck:     Vascular: No JVD.  Cardiovascular:     Rate and Rhythm: Normal rate and regular rhythm.     Heart sounds: No murmur heard.    No friction rub. No gallop.  Pulmonary:     Effort: No respiratory distress.     Breath sounds: No wheezing.  Chest:     Chest wall: Tenderness present.     Comments: Pain along  the left anterior chest wall about the lateral clavicular line about ribs 6 through 8.  No intra-abdominal tenderness.  No signs of bruising.  No rash.  No midline spinal tenderness step-offs or deformities. Abdominal:     General: There is no distension.     Tenderness: There is no abdominal tenderness. There is no guarding or rebound.  Musculoskeletal:        General: Normal range of motion.     Cervical back: Normal range of motion and neck supple.  Skin:    Coloration: Skin is not pale.     Findings: No rash.  Neurological:     Mental Status: He is alert and oriented to person, place, and time.  Psychiatric:        Behavior: Behavior normal.     ED Results / Procedures / Treatments   Labs (all labs ordered are listed, but only abnormal results are displayed) Labs Reviewed - No data to display  EKG EKG Interpretation  Date/Time:  Friday March 17 2023 07:41:45 EDT Ventricular Rate:  80 PR Interval:  140 QRS Duration: 73 QT Interval:  358 QTC Calculation: 413 R Axis:  62 Text Interpretation: Sinus rhythm No old tracing to compare Confirmed by Melene Plan [phone]) on 03/17/2023 7:46:29 AM  Radiology DG Ribs Unilateral W/Chest Left  Result Date: 03/17/2023 CLINICAL DATA:  Left rib pain for 1 week EXAM: LEFT RIBS AND CHEST - 4 VIEW COMPARISON:  07/15/2008 FINDINGS: No fracture or other bone lesions are seen involving the ribs. There is no evidence of pneumothorax or pleural effusion. Both lungs are clear. Heart size and mediastinal contours are within normal limits. IMPRESSION: Negative. Electronically Signed   By: Tiburcio Pea M.D.   On: 03/17/2023 08:04    Procedures Procedures    Medications Ordered in ED Medications - No data to display  ED Course/ Medical Decision Making/ A&P                             Medical Decision Making Amount and/or Complexity of Data Reviewed Radiology: ordered.   32 yo M with a chief complaints of chest wall pain after some sort of  interaction with his family members.  Unlikely to be ACS or PE based on history and exam.  Will treat as musculoskeletal.  Chest x-ray independently interpreted by me without pneumothorax.  No focal infiltrate.  He has a benign abdominal exam I doubt intra-abdominal injury.  Will treat supportively.  PCP follow-up.  8:43 AM:  I have discussed the diagnosis/risks/treatment options with the patient.  Evaluation and diagnostic testing in the emergency department does not suggest an emergent condition requiring admission or immediate intervention beyond what has been performed at this time.  They will follow up with PCP. We also discussed returning to the ED immediately if new or worsening sx occur. We discussed the sx which are most concerning (e.g., sudden worsening pain, fever, inability to tolerate by mouth, shortness of breath) that necessitate immediate return. Medications administered to the patient during their visit and any new prescriptions provided to the patient are listed below.  Medications given during this visit Medications - No data to display   The patient appears reasonably screen and/or stabilized for discharge and I doubt any other medical condition or other [facility] requiring further screening, evaluation, or treatment in the ED at this time prior to discharge.          Final Clinical Impression(s) / ED Diagnoses Final diagnoses:  Chest wall pain    Rx / DC Orders ED Discharge Orders     None         Melene Plan, DO 03/17/23 [phone]

## 2023-03-17 NOTE — ED Triage Notes (Signed)
States has been having left rib pain x 1 week. He thinks may have injured playing around at home and landed on left ribs.

## 2023-03-17 NOTE — Discharge Instructions (Signed)
Take 4 over the counter ibuprofen tablets 3 times a day or 2 over-the-counter naproxen tablets twice a day for pain. Also take tylenol 1000mg (2 extra strength) four times a day.    Usually this is improved with holding some sort of support against it.  Commonly this is a pillow.  If you develop difficulty breathing or have a fever then please return to the emergency department for repeat evaluation.  I do not see a primary care provider listed for you.  This is something that everyone needs to have eventually.  There is a sports medicine physician in this building, if you have persistent symptoms they may be able to see and help you with your problem.

## 2023-03-24 ENCOUNTER — Ambulatory Visit: Payer: 59 | Admitting: Family Medicine

## 2023-03-30 ENCOUNTER — Encounter: Payer: Self-pay | Admitting: *Deleted

## 2024-08-02 ENCOUNTER — Encounter (HOSPITAL_BASED_OUTPATIENT_CLINIC_OR_DEPARTMENT_OTHER): Payer: Self-pay | Admitting: Emergency Medicine

## 2024-08-02 ENCOUNTER — Emergency Department (HOSPITAL_BASED_OUTPATIENT_CLINIC_OR_DEPARTMENT_OTHER)
Admission: EM | Admit: 2024-08-02 | Discharge: 2024-08-02 | Disposition: A | Discharge status: Home / Self Care | Attending: Emergency Medicine | Admitting: Emergency Medicine

## 2024-08-02 ENCOUNTER — Other Ambulatory Visit: Payer: Self-pay

## 2024-08-02 DIAGNOSIS — R590 Localized enlarged lymph nodes: Secondary | ICD-10-CM | POA: Diagnosis present

## 2024-08-02 NOTE — ED Triage Notes (Signed)
 States has noticed a hard knot on neck below his chin. No redness but hard circular area palpated to ant neck

## 2024-08-02 NOTE — Discharge Instructions (Addendum)
 The lump under your chin is an enlarged lymph node. This should decrease in size on its own within the next 2 weeks  You may apply warm compresses to your chin to help with pain and swelling.  You may take up to 1000mg  of tylenol  every 6 hours as needed for pain. Do not take more then 4g per day.   You may use up to 600mg  ibuprofen  every 6 hours as needed for pain.  Do not exceed 2.4g of ibuprofen  per day.  If the bump does not start to decrease on its own within the next 2 weeks, please follow-up with the ENT office listed below. You will have to call to schedule this appointment if needed.   Please return to the ER for any fever, difficulty swallowing, increased pain, any other new or concerning symptoms

## 2024-08-02 NOTE — ED Provider Notes (Signed)
 [place] [facility] Provider Note   CSN: [id number] Arrival date & time: 08/02/24  1015     Patient presents with: neck   Gabriel Simmons is a 33 y.o. male no significant past medical history presents with concern for a lump underneath his chin.  States he noticed this about a week ago and his grown a little bit in size since he first noticed it.  He does report some discomfort with it.  Denies any difficulty swallowing.  Denies any fever, chills, sore throat, cough.   HPI     Prior to Admission medications   Not on File    Allergies: Patient has no known allergies.    Review of Systems  Constitutional:  Negative for chills and fever.    Updated Vital Signs BP 125/78   Pulse 80   Temp 98.8 F (37.1 C) (Oral)   Resp 16   SpO2 100%   Physical Exam Vitals and nursing note reviewed.  Constitutional:      Appearance: Normal appearance.  HENT:     Head: Atraumatic.     Mouth/Throat:     Comments: Opens mouth fully, no trismus.  Swallowing secretions without difficulty.  Left tonsil appears slightly more enlarged, 3+, compared to the right tonsil 2+. No signs of peritonsillar abscess.  Posterior pharynx without any erythema or edema.  Sublingual space without any erythema, edema.  Neck:     Comments: Mobile and circular structure appreciated in the submental space, approximately 2cm in size. Seems consistent with submental lymphadenopathy   No skin changes, neck soft and supple. No areas of fluctuance or induration Cardiovascular:     Rate and Rhythm: Normal rate and regular rhythm.  Pulmonary:     Effort: Pulmonary effort is normal.  Musculoskeletal:     Cervical back: Normal range of motion. No rigidity.  Neurological:     General: No focal deficit present.     Mental Status: He is alert.  Psychiatric:        Mood and Affect: Mood normal.        Behavior: Behavior normal.     (all labs ordered are listed, but only  abnormal results are displayed) Labs Reviewed - No data to display  EKG: None  Radiology: No results found.   Procedures   Medications Ordered in the ED - No data to display                                  Medical Decision Making    Differential diagnosis includes but is not limited to lymphadenopathy, abscess, malignancy, Ludwig angina  ED Course:  Upon initial evaluation, patient is well appearing, no acute distress. Stable vitals. I am able to palpate an enlarged submental lymph node on exam where patient is reporting the lump.  No overlying fluctuance or induration, low concern for abscess.  Sublingual space and neck without any erythema, edema, no concern for ludwig angina. Low concern for any other emergent pathology. I had my attending Dr. Darra also evaluate patient who is in agreement with assessment and plan of care.   Impression: Submental lymphadenopathy  Disposition:  The patient was discharged home with instructions to follow up with ENT if submental lymph node not improving on its own within the next 14 days.  Contact information for ENT provided.  May take Tylenol  and ibuprofen  as needed for pain.  Warm  compresses to help with pain and swelling. Return precautions given.    This chart was dictated using voice recognition software, Dragon. Despite the best efforts of this provider to proofread and correct errors, errors may still occur which can change documentation meaning.       Final diagnoses:  Submental lymphadenopathy    ED Discharge Orders     None          Veta Palma, [place] 08/02/24 1051    Long, Fonda MATSU, MD 08/02/24 1155
